# Patient Record
Sex: Male | Born: 2000 | Race: White | Hispanic: No | Marital: Single | Smoking: Never smoker
Health system: Southern US, Community
[De-identification: ages and names within clinical notes are randomized; demographics above are authoritative.]

---
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
## 2020-10-02 IMAGING — DX CHEST  1 VIEW
1 series · 1 of 1 positions shown · non-contrast
Comparison: None.

CLINICAL DATA: Chest pain.

EXAM:
CHEST  1 VIEW

[chest ap]
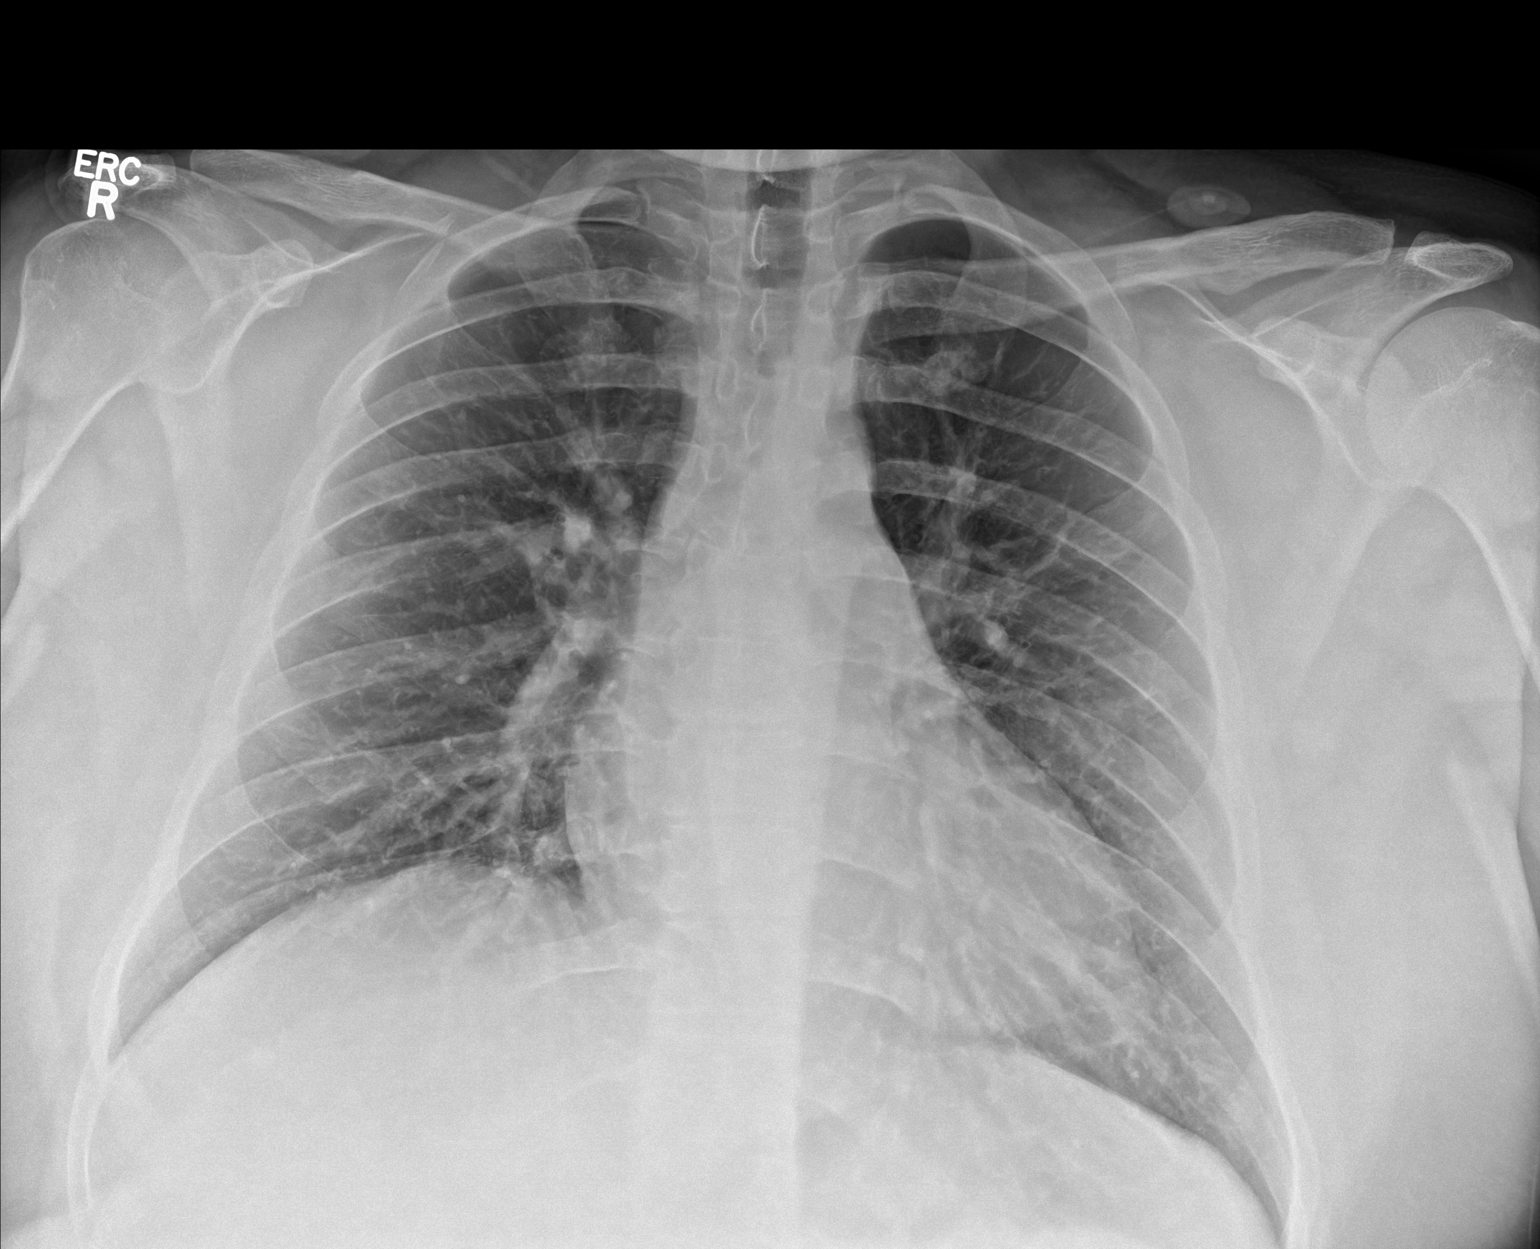

[1 of 1 positions shown; findings below may reference images not displayed]

FINDINGS: The cardiomediastinal contours are normal. The lungs are clear.
Pulmonary vasculature is normal. No consolidation, pleural effusion,
or pneumothorax. No acute osseous abnormalities are seen.
IMPRESSION: Negative AP view of the chest.
